# Patient Record
Sex: Female | Born: 1960 | Race: White | Hispanic: No | Marital: Married
Health system: Southern US, Community
[De-identification: ages and names within clinical notes are randomized; demographics above are authoritative.]

---

## 1998-09-29 ENCOUNTER — Encounter: Payer: Self-pay | Admitting: Obstetrics and Gynecology

## 1998-09-29 ENCOUNTER — Ambulatory Visit (HOSPITAL_COMMUNITY): Admission: RE | Admit: 1998-09-29 | Discharge: 1998-09-29 | Payer: Self-pay | Admitting: Obstetrics and Gynecology

## 1999-08-26 ENCOUNTER — Inpatient Hospital Stay (HOSPITAL_COMMUNITY): Admission: AD | Admit: 1999-08-26 | Discharge: 1999-08-29 | Payer: Self-pay | Admitting: Obstetrics and Gynecology

## 1999-10-02 ENCOUNTER — Other Ambulatory Visit: Admission: RE | Admit: 1999-10-02 | Discharge: 1999-10-02 | Payer: Self-pay | Admitting: Obstetrics and Gynecology

## 2000-10-19 ENCOUNTER — Other Ambulatory Visit: Admission: RE | Admit: 2000-10-19 | Discharge: 2000-10-19 | Payer: Self-pay | Admitting: Obstetrics and Gynecology

## 2001-11-02 ENCOUNTER — Other Ambulatory Visit: Admission: RE | Admit: 2001-11-02 | Discharge: 2001-11-02 | Payer: Self-pay | Admitting: Obstetrics and Gynecology

## 2002-12-26 ENCOUNTER — Other Ambulatory Visit: Admission: RE | Admit: 2002-12-26 | Discharge: 2002-12-26 | Payer: Self-pay | Admitting: Obstetrics and Gynecology

## 2003-01-24 ENCOUNTER — Ambulatory Visit (HOSPITAL_COMMUNITY): Admission: RE | Admit: 2003-01-24 | Discharge: 2003-01-24 | Payer: Self-pay | Admitting: Obstetrics and Gynecology

## 2003-04-26 ENCOUNTER — Ambulatory Visit (HOSPITAL_COMMUNITY): Admission: RE | Admit: 2003-04-26 | Discharge: 2003-04-26 | Payer: Self-pay | Admitting: Obstetrics and Gynecology

## 2003-07-11 ENCOUNTER — Inpatient Hospital Stay (HOSPITAL_COMMUNITY): Admission: AD | Admit: 2003-07-11 | Discharge: 2003-07-15 | Payer: Self-pay | Admitting: Obstetrics and Gynecology

## 2003-07-16 ENCOUNTER — Inpatient Hospital Stay (HOSPITAL_COMMUNITY): Admission: AD | Admit: 2003-07-16 | Discharge: 2003-07-19 | Payer: Self-pay | Admitting: Obstetrics and Gynecology

## 2003-08-22 ENCOUNTER — Other Ambulatory Visit: Admission: RE | Admit: 2003-08-22 | Discharge: 2003-08-22 | Payer: Self-pay | Admitting: Obstetrics and Gynecology

## 2005-01-22 ENCOUNTER — Other Ambulatory Visit: Admission: RE | Admit: 2005-01-22 | Discharge: 2005-01-22 | Payer: Self-pay | Admitting: Obstetrics and Gynecology

## 2013-05-26 ENCOUNTER — Other Ambulatory Visit: Payer: Self-pay | Admitting: Obstetrics and Gynecology

## 2013-05-26 DIAGNOSIS — R928 Other abnormal and inconclusive findings on diagnostic imaging of breast: Secondary | ICD-10-CM

## 2013-06-06 ENCOUNTER — Ambulatory Visit
Admission: RE | Admit: 2013-06-06 | Discharge: 2013-06-06 | Disposition: A | Discharge status: Home / Self Care | Payer: BC Managed Care – PPO | Source: Ambulatory Visit | Attending: Obstetrics and Gynecology | Admitting: Obstetrics and Gynecology

## 2013-06-06 ENCOUNTER — Other Ambulatory Visit: Payer: Self-pay | Admitting: Obstetrics and Gynecology

## 2013-06-06 DIAGNOSIS — R928 Other abnormal and inconclusive findings on diagnostic imaging of breast: Secondary | ICD-10-CM

## 2014-08-17 ENCOUNTER — Other Ambulatory Visit: Payer: Self-pay | Admitting: Obstetrics and Gynecology

## 2014-08-18 LAB — CYTOLOGY - PAP

## 2017-06-11 ENCOUNTER — Encounter (INDEPENDENT_AMBULATORY_CARE_PROVIDER_SITE_OTHER): Payer: Self-pay

## 2017-06-11 ENCOUNTER — Other Ambulatory Visit: Payer: Self-pay | Admitting: Obstetrics and Gynecology

## 2017-06-11 ENCOUNTER — Ambulatory Visit
Admission: RE | Admit: 2017-06-11 | Discharge: 2017-06-11 | Disposition: A | Discharge status: Home / Self Care | Payer: BC Managed Care – PPO | Source: Ambulatory Visit | Attending: Obstetrics and Gynecology | Admitting: Obstetrics and Gynecology

## 2017-06-11 DIAGNOSIS — R928 Other abnormal and inconclusive findings on diagnostic imaging of breast: Secondary | ICD-10-CM

## 2017-06-11 DIAGNOSIS — N6320 Unspecified lump in the left breast, unspecified quadrant: Secondary | ICD-10-CM

## 2017-06-12 ENCOUNTER — Other Ambulatory Visit: Payer: Self-pay | Admitting: Obstetrics and Gynecology

## 2017-06-12 ENCOUNTER — Ambulatory Visit
Admission: RE | Admit: 2017-06-12 | Discharge: 2017-06-12 | Disposition: A | Discharge status: Home / Self Care | Payer: BC Managed Care – PPO | Source: Ambulatory Visit | Attending: Obstetrics and Gynecology | Admitting: Obstetrics and Gynecology

## 2017-06-12 DIAGNOSIS — N6002 Solitary cyst of left breast: Secondary | ICD-10-CM

## 2017-06-12 DIAGNOSIS — N6320 Unspecified lump in the left breast, unspecified quadrant: Secondary | ICD-10-CM

## 2018-10-05 IMAGING — MG 2D DIGITAL DIAGNOSTIC UNILATERAL LEFT MAMMOGRAM WITH CAD AND ADJ
6 series · 6 of 14 positions shown · non-contrast
Comparison: Previous exam(s).

CLINICAL DATA: Evaluate left breast mass after aspiration.

EXAM:
2D DIGITAL DIAGNOSTIC UNILATERAL LEFT MAMMOGRAM WITH CAD AND ADJUNCT
TOMO

[L CC]
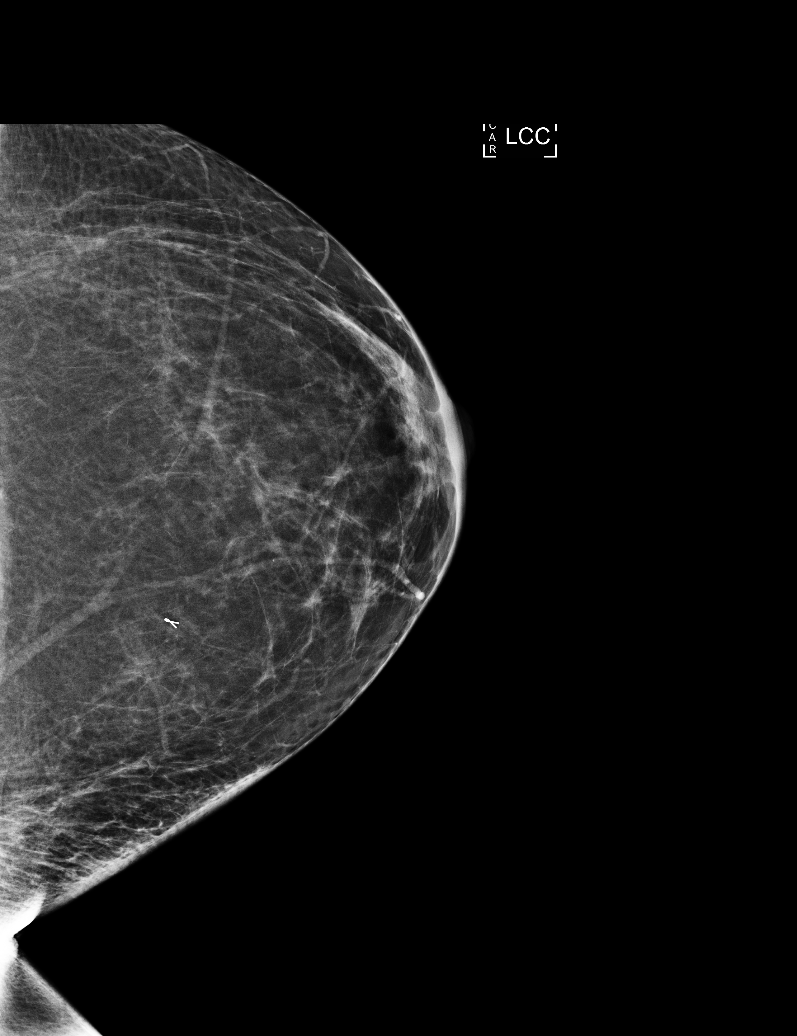

[L CC synth-2D]
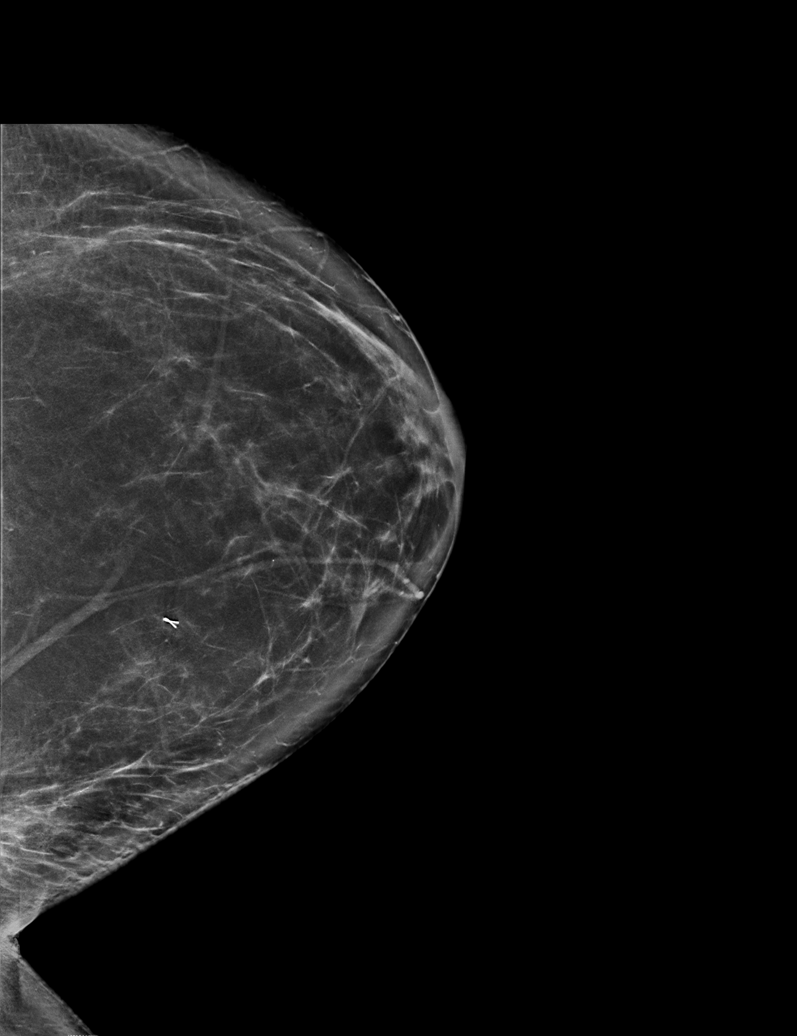

[L MLO synth-2D]
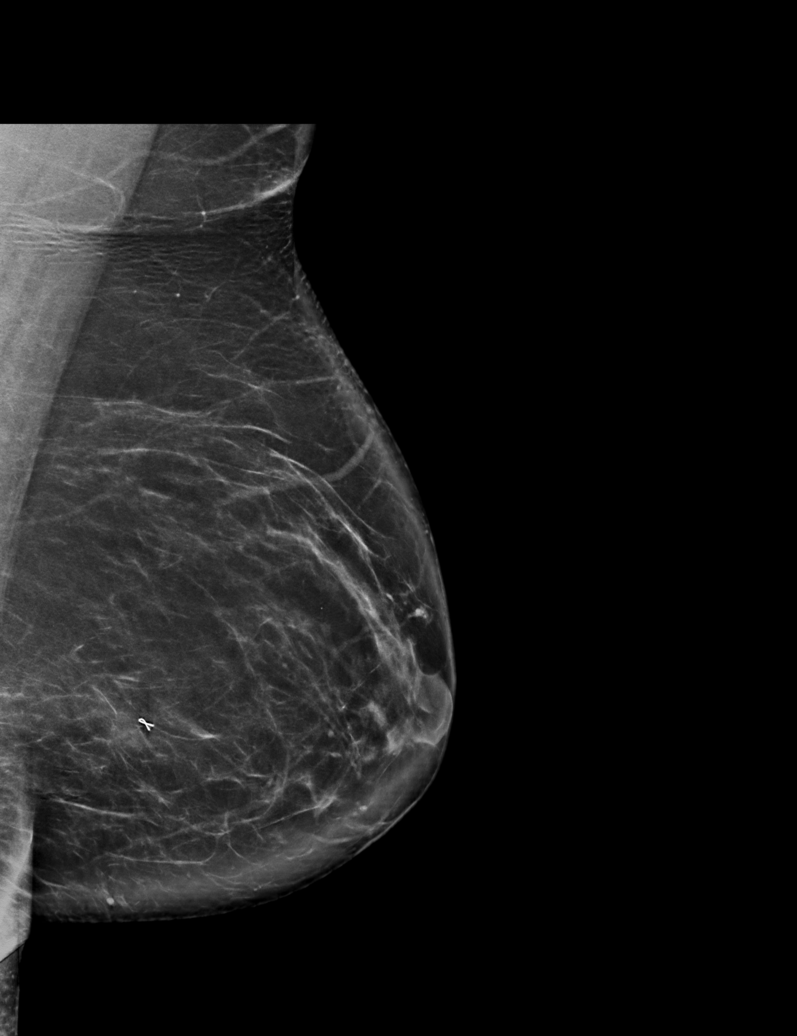

[L MLO]
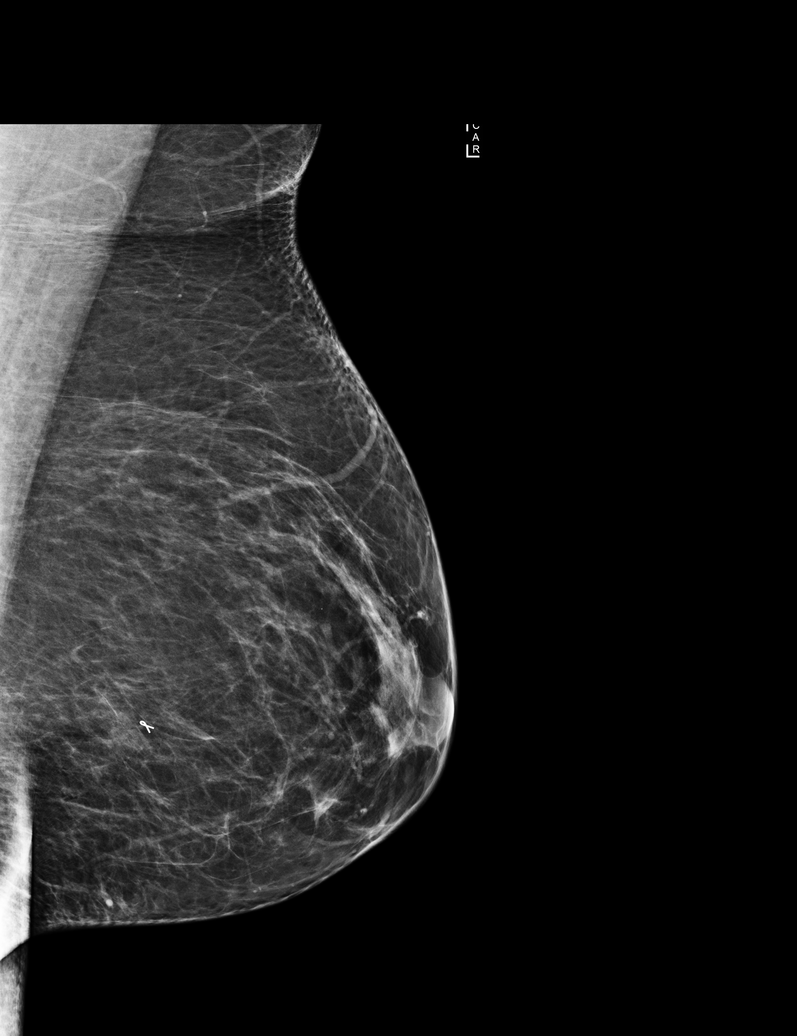

[L CC tomo · tomo slice 36/71.0]
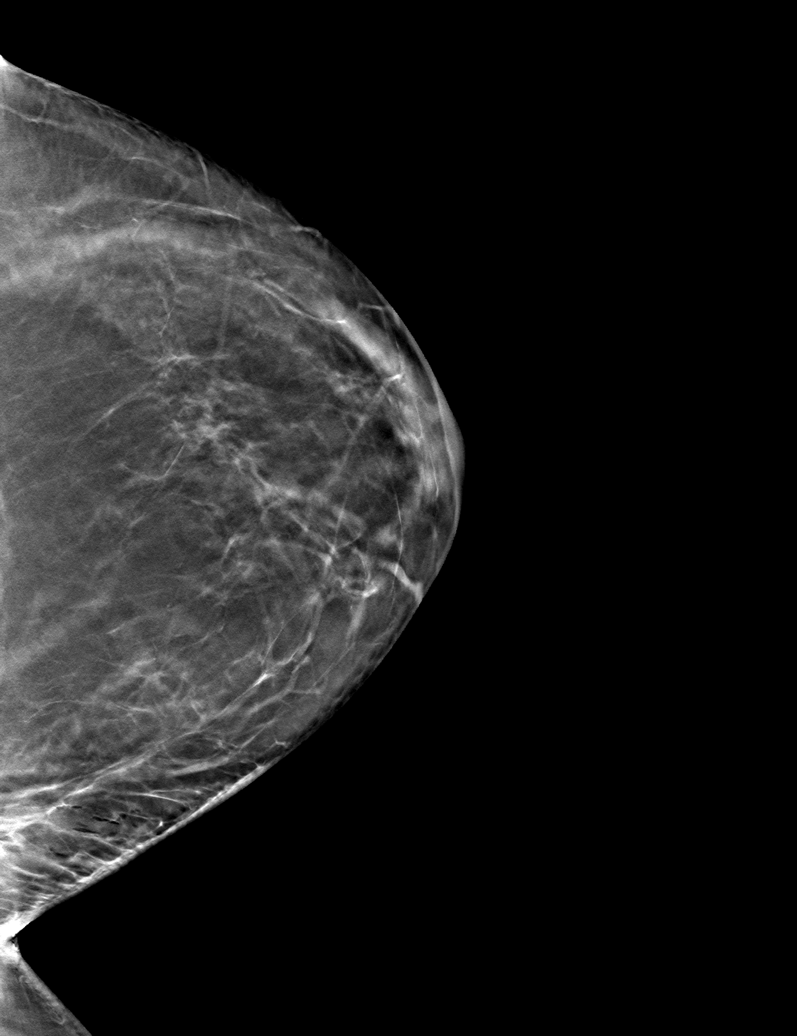

[L MLO tomo · tomo slice 43/85.0]
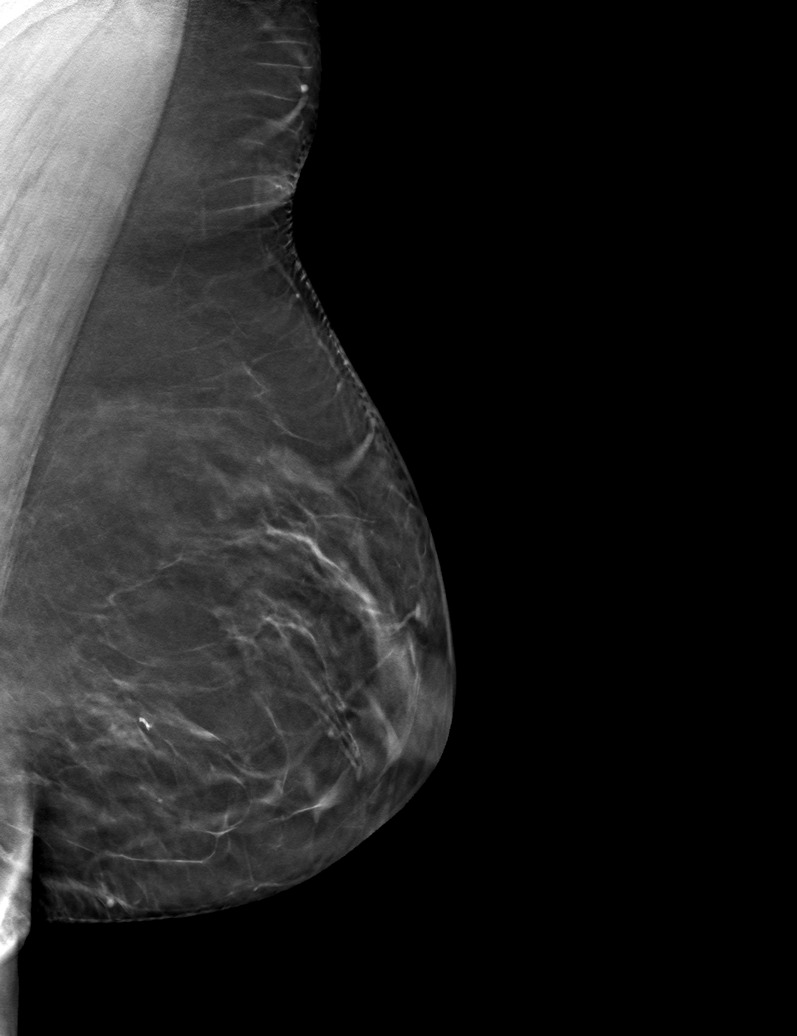

[6 of 14 positions shown; findings below may reference images not displayed]

ACR Breast Density Category b: There are scattered areas of
fibroglandular density.
FINDINGS: The mass resolved after aspiration.

Mammographic images were processed with CAD.
IMPRESSION: The mass resolved after aspiration.

RECOMMENDATION:
Recommend returning to annual screening mammography.

I have discussed the findings and recommendations with the patient.
Results were also provided in writing at the conclusion of the
visit. If applicable, a reminder letter will be sent to the patient
regarding the next appointment.

BI-RADS CATEGORY  2: Benign.

## 2018-10-05 IMAGING — US ULTRASOUND GUIDED BREAST CYST ASPIRATION
1 series · 3 of 3 positions shown · non-contrast
Comparison: Previous exams.

CLINICAL DATA: Left breast cyst aspiration.

EXAM:
ULTRASOUND GUIDED LEFT BREAST CYST ASPIRATION

[Series 1: ultrasound guided breast cyst aspiration · 0.07mm/px · 3 of 3 slices shown]
[im 1/3]
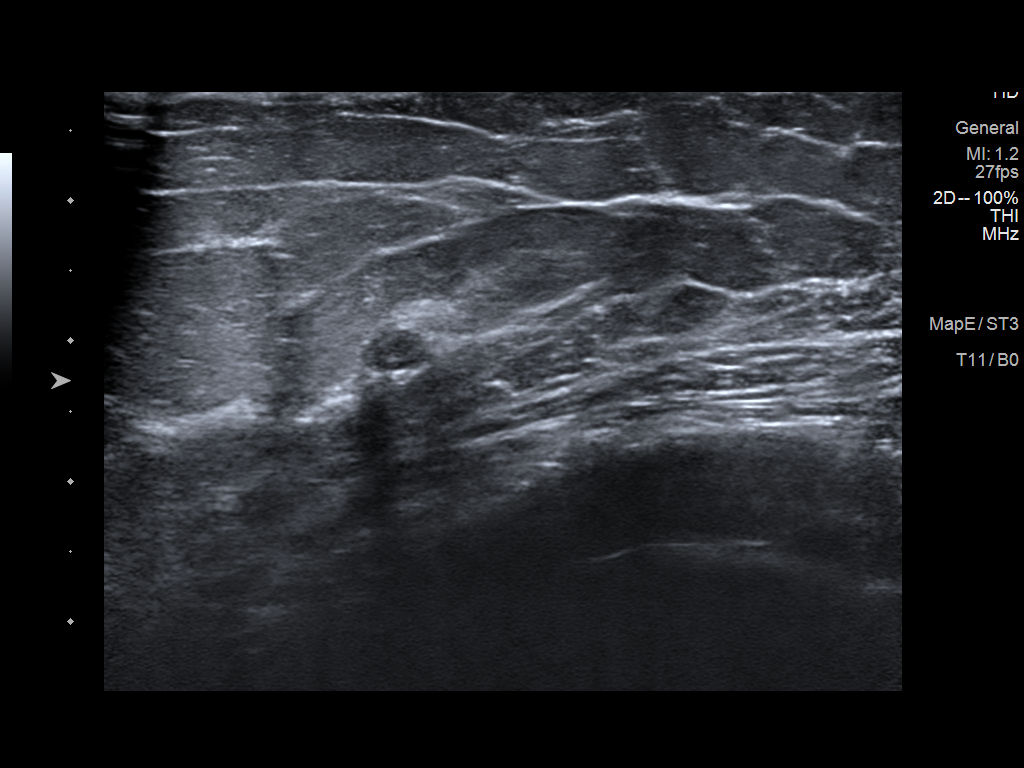
[im 2/3]
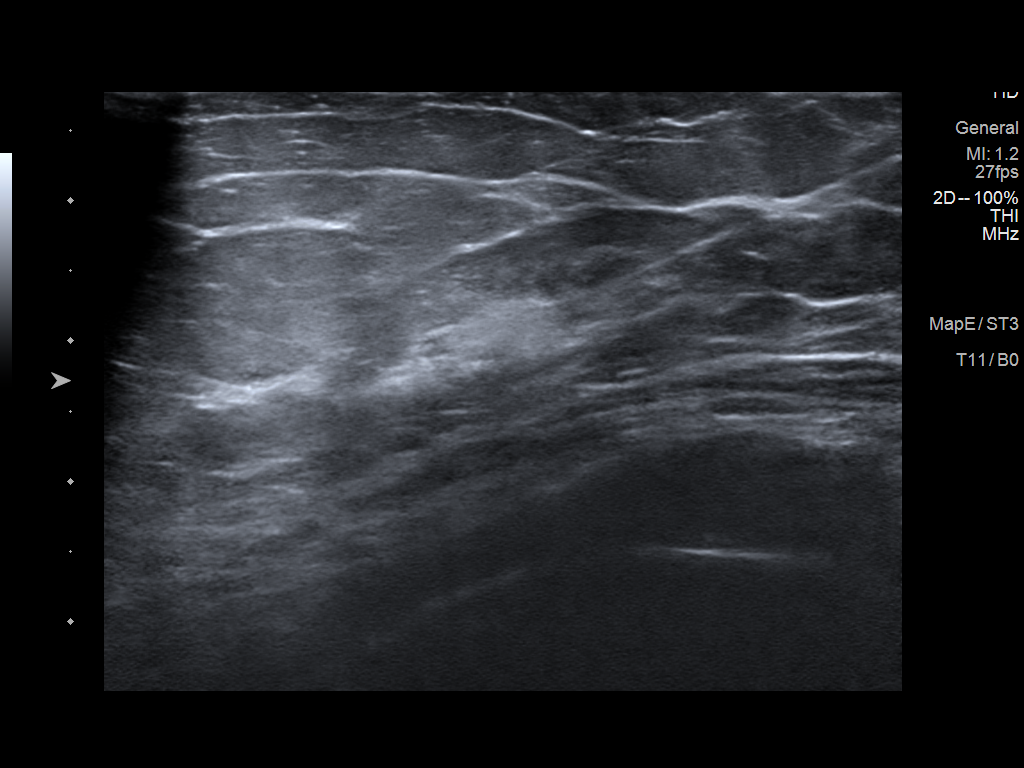
[im 3/3]
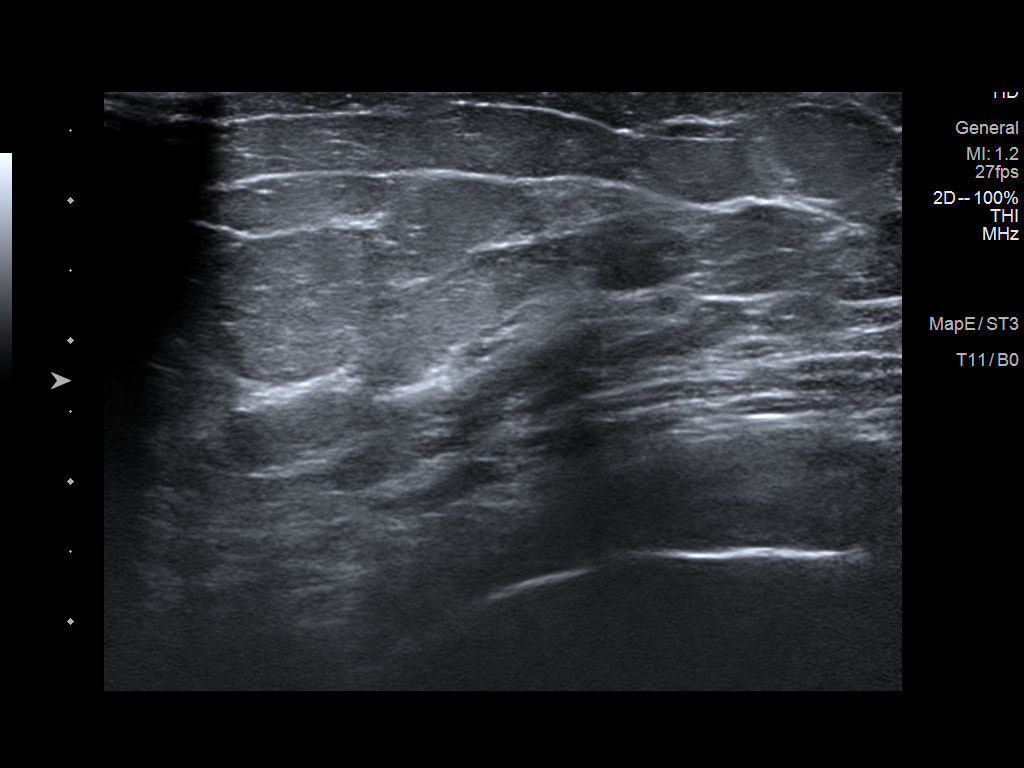

[3 of 3 positions shown; findings below may reference images not displayed]

PROCEDURE:
Using sterile technique, 1% lidocaine, under direct ultrasound
visualization, needle aspiration of a mass at 9 o'clock, 6 cm from
the nipple was performed. The mass completely aspirated, confirming
its cystic nature. Follow-up mammography demonstrated resolution of
the mammographically identified mass.
IMPRESSION: Ultrasound-guided aspiration of a left breast cyst No apparent
complications.

RECOMMENDATIONS:
Recommend returning to annual screening mammography.

## 2019-06-09 ENCOUNTER — Other Ambulatory Visit: Payer: Self-pay | Admitting: Family Medicine

## 2019-06-09 DIAGNOSIS — Z20822 Contact with and (suspected) exposure to covid-19: Secondary | ICD-10-CM

## 2019-06-12 LAB — NOVEL CORONAVIRUS, NAA: SARS-CoV-2, NAA: NOT DETECTED

## 2020-10-05 ENCOUNTER — Ambulatory Visit: Payer: BC Managed Care – PPO | Attending: Internal Medicine

## 2020-10-05 DIAGNOSIS — Z23 Encounter for immunization: Secondary | ICD-10-CM

## 2020-10-05 NOTE — Progress Notes (Signed)
   Covid-[facility] Vaccination [facility]  Name:  MISAKO ROEDER    MRN: [id number] DOB: 1961-07-01  10/05/2020  Ms. Rihn was observed post Covid-19 immunization for 15 minutes without incident. She was provided with Vaccine Information Sheet and instruction to access the V-Safe system.   Ms. Costanza was instructed to call 911 with any severe reactions post vaccine: Marland Kitchen Difficulty breathing  . Swelling of face and throat  . A fast heartbeat  . A bad rash all over body  . Dizziness and weakness   Immunizations Administered    Name Date Dose VIS Date Route   Pfizer COVID-19 Vaccine 10/05/2020  3:57 PM 0.3 mL 09/05/2020 Intramuscular   Manufacturer: [employer], [employer]   Lot: [id number]   NDC: [address]-7
